# Patient Record
Sex: Male | Born: 1960 | Race: White | Hispanic: No | Smoking: Current every day smoker
Health system: Southern US, Community
[De-identification: ages and names within clinical notes are randomized; demographics above are authoritative.]

## PROBLEM LIST (undated history)

## (undated) DIAGNOSIS — E079 Disorder of thyroid, unspecified: Secondary | ICD-10-CM

## (undated) DIAGNOSIS — F410 Panic disorder [episodic paroxysmal anxiety] without agoraphobia: Secondary | ICD-10-CM

## (undated) HISTORY — PX: NASAL SEPTUM SURGERY: SHX37

## (undated) HISTORY — PX: THYROID SURGERY: SHX805

## (undated) HISTORY — PX: TONSILLECTOMY: SUR1361

---

## 2005-05-01 ENCOUNTER — Emergency Department: Payer: Self-pay | Admitting: Emergency Medicine

## 2006-05-11 ENCOUNTER — Emergency Department: Payer: Self-pay | Admitting: General Practice

## 2007-04-03 ENCOUNTER — Ambulatory Visit: Payer: Self-pay | Admitting: Podiatry

## 2010-02-15 ENCOUNTER — Ambulatory Visit: Payer: Self-pay | Admitting: Otolaryngology

## 2015-03-20 ENCOUNTER — Other Ambulatory Visit: Payer: Self-pay | Admitting: Urology

## 2015-03-20 DIAGNOSIS — R109 Unspecified abdominal pain: Secondary | ICD-10-CM

## 2015-03-20 DIAGNOSIS — N200 Calculus of kidney: Secondary | ICD-10-CM

## 2015-04-14 ENCOUNTER — Ambulatory Visit
Admission: RE | Admit: 2015-04-14 | Discharge: 2015-04-14 | Disposition: A | Discharge status: Home / Self Care | Payer: 59 | Source: Ambulatory Visit | Attending: Urology | Admitting: Urology

## 2015-04-14 DIAGNOSIS — N200 Calculus of kidney: Secondary | ICD-10-CM

## 2015-04-14 DIAGNOSIS — R109 Unspecified abdominal pain: Secondary | ICD-10-CM

## 2016-02-05 ENCOUNTER — Encounter: Payer: Self-pay | Admitting: Emergency Medicine

## 2016-02-05 ENCOUNTER — Emergency Department
Admission: EM | Admit: 2016-02-05 | Discharge: 2016-02-05 | Disposition: A | Discharge status: Home / Self Care | Payer: 59 | Attending: Emergency Medicine | Admitting: Emergency Medicine

## 2016-02-05 DIAGNOSIS — F410 Panic disorder [episodic paroxysmal anxiety] without agoraphobia: Secondary | ICD-10-CM | POA: Diagnosis present

## 2016-02-05 DIAGNOSIS — F17210 Nicotine dependence, cigarettes, uncomplicated: Secondary | ICD-10-CM | POA: Diagnosis not present

## 2016-02-05 DIAGNOSIS — E079 Disorder of thyroid, unspecified: Secondary | ICD-10-CM | POA: Diagnosis not present

## 2016-02-05 DIAGNOSIS — F419 Anxiety disorder, unspecified: Secondary | ICD-10-CM | POA: Insufficient documentation

## 2016-02-05 HISTORY — DX: Disorder of thyroid, unspecified: E07.9

## 2016-02-05 HISTORY — DX: Panic disorder (episodic paroxysmal anxiety): F41.0

## 2016-02-05 MED ORDER — LORAZEPAM 0.5 MG PO TABS: 0.5000 mg | ORAL_TABLET | Freq: Two times a day (BID) | ORAL | Status: AC

## 2016-02-05 NOTE — ED Notes (Signed)
Pt states has history of anxiety attacks and states today on the way to work had panic attack. Pt reports feeling SOB and shaking. Pt reports increased stress at work and has recently been diagnosed with borderline diabetes.

## 2016-02-05 NOTE — Discharge Instructions (Signed)

## 2016-02-05 NOTE — ED Notes (Signed)
Pt says he has been having panic attacks for about a month--since he was dx with sinus infection.  Says he took 2 courses of antibiotics and an inhaler.  Says he is having frequent attacks where he cant breath and does not feel it is from wheezing.  Says he had these once before years ago, but they got better.  Says he has them while brushing teeth, while in shower and had one today on way to work.

## 2016-02-05 NOTE — ED Notes (Signed)
Pt informed to return if any life threatening symptoms occur.  

## 2016-02-06 NOTE — ED Provider Notes (Signed)
[facility] Emergency Department Provider Note  ____________________________________________    I have reviewed the triage vital signs and the nursing notes.   HISTORY  Chief Complaint Panic Attack    HPI Larry Webster is a 55 y.o. male who presents with complaints of panic attacks. Patient reports however he holds his breath he gets extremely anxious. This can occur while he is brushing his teeth or taking a shower. He denies chest pain. He reports he believes this started when he had a sinus infection and couldn't breathe through his nose. He reports when he has these attacks he feels as though he can't breathe. Currently feels well and has no symptoms     Past Medical History  Diagnosis Date  . Panic attacks   . Thyroid disease     There are no active problems to display for this patient.   Past Surgical History  Procedure Laterality Date  . Thyroid surgery    . Nasal septum surgery    . Tonsillectomy      Current Outpatient Rx  Name  Route  Sig  Dispense  Refill  . LORazepam (ATIVAN) 0.5 MG tablet   Oral   Take 1 tablet (0.5 mg total) by mouth 2 (two) times daily.   14 tablet   0     Allergies Review of patient's allergies indicates no known allergies.  No family history on file.  Social History Social History  Substance Use Topics  . Smoking status: Current Every Day Smoker -- 0.50 packs/day    Types: Cigarettes  . Smokeless tobacco: None  . Alcohol Use: Yes     Comment: occasional    Review of Systems  Constitutional: Negative for fever. Eyes: Negative for redness ENT: Negative for sore throat Cardiovascular: Negative for chest pain Respiratory: Short of breath when having a panic attack Gastrointestinal: Negative for abdominal pain Genitourinary: Negative for dysuria. Musculoskeletal: Negative for back pain. Skin: Negative for rash. Neurological: Negative for focal weakness Psychiatric: As  above    ____________________________________________   PHYSICAL EXAM:  VITAL SIGNS: ED Triage Vitals  Enc Vitals Group     BP 02/05/16 1201 126/83 mmHg     Pulse Rate 02/05/16 1201 82     Resp 02/05/16 1201 20     Temp 02/05/16 1201 98 F (36.7 C)     Temp Source 02/05/16 1201 Oral     SpO2 02/05/16 1201 98 %     Weight 02/05/16 1201 185 lb (83.915 kg)     Height 02/05/16 1201  (1.753 m)     Head Cir --      Peak Flow --      Pain Score 02/05/16 1359 3     Pain Loc --      Pain Edu? --      Excl. in GC? --      Constitutional: Alert and oriented. Well appearing and in no distress.  Eyes: Conjunctivae are normal. No erythema or injection ENT   Head: Normocephalic and atraumatic.   Mouth/Throat: Mucous membranes are moist. Cardiovascular: Normal rate, regular rhythm. Normal and symmetric distal pulses are present in the upper extremities.  Respiratory: Normal respiratory effort without tachypnea nor retractions. Breath sounds are clear and equal bilaterally.  Gastrointestinal: Soft and non-tender in all quadrants. No distention. There is no CVA tenderness. Genitourinary: deferred Musculoskeletal: Nontender with normal range of motion in all extremities.  Neurologic:  Normal speech and language. No gross focal neurologic deficits are  appreciated. Skin:  Skin is warm, dry and intact. No rash noted. Psychiatric: Mood and affect are normal. Patient exhibits appropriate insight and judgment.  ____________________________________________    LABS (pertinent positives/negatives)  Labs Reviewed - No data to display  ____________________________________________   EKG  None  ____________________________________________    RADIOLOGY  None  ____________________________________________   PROCEDURES  Procedure(s) performed: none  Critical Care performed: none  ____________________________________________   INITIAL IMPRESSION / ASSESSMENT AND  PLAN / ED COURSE  Pertinent labs & imaging results that were available during my care of the patient were reviewed by me and considered in my medical decision making (see chart for details).  Well-appearing and in no distress. He is not having any symptoms in the emergency department. His symptoms certainly sound consistent with panic attacks given that they occur when he is holding his breath however briefly. I prescribed him a short dose of diazepam and asked him to follow up with his PCP for possible SSRI   ____________________________________________   FINAL CLINICAL IMPRESSION(S) / ED DIAGNOSES  Final diagnoses:  Anxiety attack          Larry Everyobert Rehan Holness, MD 02/06/16 [phone]

## 2018-10-11 ENCOUNTER — Encounter: Payer: Self-pay | Admitting: Emergency Medicine

## 2018-10-11 ENCOUNTER — Emergency Department: Payer: 59

## 2018-10-11 ENCOUNTER — Other Ambulatory Visit: Payer: Self-pay

## 2018-10-11 ENCOUNTER — Emergency Department
Admission: EM | Admit: 2018-10-11 | Discharge: 2018-10-11 | Disposition: A | Discharge status: Home / Self Care | Payer: 59 | Attending: Emergency Medicine | Admitting: Emergency Medicine

## 2018-10-11 DIAGNOSIS — F17210 Nicotine dependence, cigarettes, uncomplicated: Secondary | ICD-10-CM | POA: Insufficient documentation

## 2018-10-11 DIAGNOSIS — N200 Calculus of kidney: Secondary | ICD-10-CM | POA: Diagnosis not present

## 2018-10-11 DIAGNOSIS — R109 Unspecified abdominal pain: Secondary | ICD-10-CM | POA: Diagnosis present

## 2018-10-11 LAB — COMPREHENSIVE METABOLIC PANEL
ALT: 23 U/L (ref 0–44)
AST: 28 U/L (ref 15–41)
Albumin: 4.2 g/dL (ref 3.5–5.0)
Alkaline Phosphatase: 63 U/L (ref 38–126)
Anion gap: 8 (ref 5–15)
BUN: 23 mg/dL — ABNORMAL HIGH (ref 6–20)
CHLORIDE: 106 mmol/L (ref 98–111)
CO2: 27 mmol/L (ref 22–32)
CREATININE: 1.31 mg/dL — AB (ref 0.61–1.24)
Calcium: 9 mg/dL (ref 8.9–10.3)
GFR calc Af Amer: >60 mL/min (ref >60)
Glucose, Bld: 166 mg/dL — ABNORMAL HIGH (ref 70–99)
Potassium: 3.9 mmol/L (ref 3.5–5.1)
Sodium: 141 mmol/L (ref 135–145)
Total Bilirubin: 0.6 mg/dL (ref 0.3–1.2)
Total Protein: 7.5 g/dL (ref 6.5–8.1)

## 2018-10-11 LAB — URINALYSIS, COMPLETE (UACMP) WITH MICROSCOPIC
BACTERIA UA: NONE SEEN
BILIRUBIN URINE: NEGATIVE
Glucose, UA: NEGATIVE mg/dL
Ketones, ur: NEGATIVE mg/dL
Nitrite: NEGATIVE
Protein, ur: 30 mg/dL — AB
RBC / HPF: >50 RBC/hpf — ABNORMAL HIGH (ref 0–5)
SPECIFIC GRAVITY, URINE: 1.019 (ref 1.005–1.030)
Squamous Epithelial / HPF: NONE SEEN (ref 0–5)
pH: 6 (ref 5.0–8.0)

## 2018-10-11 LAB — CBC
HCT: 39.1 % (ref 39.0–52.0)
Hemoglobin: 12.7 g/dL — ABNORMAL LOW (ref 13.0–17.0)
MCH: 28.7 pg (ref 26.0–34.0)
MCHC: 32.5 g/dL (ref 30.0–36.0)
MCV: 88.5 fL (ref 80.0–100.0)
Platelets: 312 10*3/uL (ref 150–400)
RBC: 4.42 MIL/uL (ref 4.22–5.81)
RDW: 13.6 % (ref 11.5–15.5)
WBC: 8.3 10*3/uL (ref 4.0–10.5)
nRBC: 0 % (ref 0.0–0.2)

## 2018-10-11 LAB — LIPASE, BLOOD: Lipase: 35 U/L (ref 11–51)

## 2018-10-11 MED ORDER — FENTANYL CITRATE (PF) 100 MCG/2ML IJ SOLN
50.0000 ug | INTRAMUSCULAR | Status: AC | PRN
  Administered 2018-10-11 (×2): 50 ug via INTRAVENOUS

## 2018-10-11 MED ORDER — TAMSULOSIN HCL 0.4 MG PO CAPS: 0.4000 mg | ORAL_CAPSULE | Freq: Every day | ORAL | 0 refills | Status: AC

## 2018-10-11 MED ORDER — KETOROLAC TROMETHAMINE 30 MG/ML IJ SOLN
30.0000 mg | Freq: Once | INTRAMUSCULAR | Status: AC
  Administered 2018-10-11: 30 mg via INTRAVENOUS
  Filled 2018-10-11: qty 1

## 2018-10-11 MED ORDER — OXYCODONE-ACETAMINOPHEN 5-325 MG PO TABS: 1.0000 | ORAL_TABLET | ORAL | 0 refills | Status: DC | PRN

## 2018-10-11 MED ORDER — FENTANYL CITRATE (PF) 100 MCG/2ML IJ SOLN
INTRAMUSCULAR | Status: AC
  Administered 2018-10-11: 50 ug via INTRAVENOUS
  Filled 2018-10-11: qty 2

## 2018-10-11 MED ORDER — ONDANSETRON HCL 4 MG/2ML IJ SOLN
4.0000 mg | Freq: Once | INTRAMUSCULAR | Status: AC | PRN
  Administered 2018-10-11: 4 mg via INTRAVENOUS
  Filled 2018-10-11: qty 2

## 2018-10-11 NOTE — ED Provider Notes (Signed)
[facility] Emergency Department Provider Note  Time seen: 2:49 PM  I have reviewed the triage vital signs and the nursing notes.   HISTORY  Chief Complaint Abdominal Pain    HPI Larry Webster is a 57 y.o. male with a past medical history of kidney stones, anxiety, presents to the emergency department for right flank pain.  According to the patient 3 days ago he developed severe sharp right flank pain, states the pain went away after that day however returned today and was more severe.  States some nausea but denies any vomiting.  He received pain medication upon arrival states his pain is currently 1/10.  Has had kidney stones in the past 2 years ago required an operation for 1 of the kidney stones.  Denies any dysuria or known hematuria.  No fever.   Past Medical History:  Diagnosis Date  . Panic attacks   . Thyroid disease     There are no active problems to display for this patient.   Past Surgical History:  Procedure Laterality Date  . NASAL SEPTUM SURGERY    . THYROID SURGERY    . TONSILLECTOMY      Prior to Admission medications   Not on File    No Known Allergies  No family history on file.  Social History Social History   Tobacco Use  . Smoking status: Current Every Day Smoker    Packs/day: 0.50    Types: Cigarettes  Substance Use Topics  . Alcohol use: Yes    Comment: occasional  . Drug use: No    Review of Systems Constitutional: Negative for fever Cardiovascular: Negative for chest pain. Respiratory: Negative for shortness of breath. Gastrointestinal: Right flank pain.  Positive for nausea but negative for vomiting or diarrhea. Genitourinary: Negative for dysuria or hematuria Musculoskeletal: Negative for musculoskeletal complaints Skin: Negative for skin complaints  Neurological: Negative for headache All other ROS negative  ____________________________________________   PHYSICAL EXAM:  VITAL SIGNS: ED Triage  Vitals  Enc Vitals Group     BP 10/11/18 1319 (!) 149/92     Pulse Rate 10/11/18 1319 76     Resp 10/11/18 1319 20     Temp 10/11/18 1319 98.1 F (36.7 C)     Temp Source 10/11/18 1319 Oral     SpO2 10/11/18 1319 100 %     Weight 10/11/18 1319 180 lb (81.6 kg)     Height 10/11/18 1319 5\' 9"  (1.753 m)     Head Circumference --      Peak Flow --      Pain Score 10/11/18 1326 10     Pain Loc --      Pain Edu? --      Excl. in GC? --     Constitutional: Alert and oriented. Well appearing and in no distress. Eyes: Normal exam ENT   Head: Normocephalic and atraumatic   Mouth/Throat: Mucous membranes are moist. Cardiovascular: Normal rate, regular rhythm. No murmur Respiratory: Normal respiratory effort without tachypnea nor retractions. Breath sounds are clear Gastrointestinal: Soft and nontender. No distention.  No CVA tenderness to palpation. Musculoskeletal: Nontender with normal range of motion in all extremities.  Neurologic:  Normal speech and language. No gross focal neurologic deficits  Skin:  Skin is warm, dry and intact.  Psychiatric: Mood and affect are normal.  ____________________________________________    RADIOLOGY  CT appears to show a fairly large right sided mid ureteral stone with resulting hydronephrosis.  Official CT  scan read pending.  ____________________________________________   INITIAL IMPRESSION / ASSESSMENT AND PLAN / ED COURSE  Pertinent labs & imaging results that were available during my care of the patient were reviewed by me and considered in my medical decision making (see chart for details).  Patient presents the emergency department for right flank pain started 3 days ago and then went away returned again today.  Differential would include ureterolithiasis, urinary tract infection, pyelonephritis, colitis, diverticulitis, cholecystitis, appendicitis.  Overall the patient appears extremely well with a benign abdominal exam with no CVA  tenderness.  Patient's labs have resulted showing hematuria otherwise largely within normal limits.  Will obtain CT imaging without contrast to evaluate for likely ureterolithiasis.  Patient has a urologist through [facility], we will also refer to a local urologist if the patient wishes.  Patient's pain is currently 1/10 or 0/10 per patient after receiving fentanyl in triage.  Has no complaints at this time.  CT appears to show a moderate to large sized right mid ureteral stone.  Official CT read pending.  Anticipate likely discharge home as the patient is afebrile largely pain-free with a normal white blood cell count.  Urine culture has been sent.  We will discharge with Flomax and pain medication.  Patient will follow-up with his urologist.  I discussed return precautions.  ____________________________________________   FINAL CLINICAL IMPRESSION(S) / ED DIAGNOSES  Right flank pain Kidney stone   Minna Antis, MD 10/11/18 1511

## 2018-10-11 NOTE — ED Triage Notes (Addendum)
Pt arrives with complaints of right sided abdominal pain that radiates to his lower right back. Pt states the pain feels like stabbing. Pt reports when the pain spikes he feels nauseated and lightheaded. Pt reports the pain started on thanksgiving. PT very uncomfortable in triage.

## 2018-10-11 NOTE — ED Notes (Signed)
Pt unable to sign dc paper work 

## 2018-10-11 NOTE — Discharge Instructions (Signed)
Please call urology to arrange a follow-up appointment.  Please take your medication as needed, as written.  Return to the emergency department for any fever, any significant abdominal pain, or any other symptom personally concerning to yourself.

## 2018-10-12 LAB — URINE CULTURE: Culture: NO GROWTH

## 2018-10-23 ENCOUNTER — Ambulatory Visit: Payer: Self-pay | Admitting: Urology

## 2018-11-02 ENCOUNTER — Encounter

## 2018-11-02 ENCOUNTER — Ambulatory Visit: Payer: Self-pay | Admitting: Urology

## 2019-10-20 ENCOUNTER — Emergency Department: Payer: 59

## 2019-10-20 ENCOUNTER — Other Ambulatory Visit: Payer: Self-pay

## 2019-10-20 ENCOUNTER — Emergency Department
Admission: EM | Admit: 2019-10-20 | Discharge: 2019-10-20 | Disposition: A | Discharge status: Home / Self Care | Payer: 59 | Attending: Emergency Medicine | Admitting: Emergency Medicine

## 2019-10-20 ENCOUNTER — Encounter: Payer: Self-pay | Admitting: Emergency Medicine

## 2019-10-20 DIAGNOSIS — F17210 Nicotine dependence, cigarettes, uncomplicated: Secondary | ICD-10-CM | POA: Diagnosis not present

## 2019-10-20 DIAGNOSIS — R450 Nervousness: Secondary | ICD-10-CM | POA: Insufficient documentation

## 2019-10-20 DIAGNOSIS — R0789 Other chest pain: Secondary | ICD-10-CM | POA: Diagnosis not present

## 2019-10-20 DIAGNOSIS — R079 Chest pain, unspecified: Secondary | ICD-10-CM | POA: Insufficient documentation

## 2019-10-20 DIAGNOSIS — Z0000 Encounter for general adult medical examination without abnormal findings: Secondary | ICD-10-CM | POA: Diagnosis present

## 2019-10-20 DIAGNOSIS — F419 Anxiety disorder, unspecified: Secondary | ICD-10-CM | POA: Diagnosis not present

## 2019-10-20 LAB — COMPREHENSIVE METABOLIC PANEL
ALT: 22 U/L (ref 0–44)
AST: 22 U/L (ref 15–41)
Albumin: 4.5 g/dL (ref 3.5–5.0)
Alkaline Phosphatase: 58 U/L (ref 38–126)
Anion gap: 8 (ref 5–15)
BUN: 17 mg/dL (ref 6–20)
CO2: 27 mmol/L (ref 22–32)
Calcium: 8.7 mg/dL — ABNORMAL LOW (ref 8.9–10.3)
Chloride: 106 mmol/L (ref 98–111)
Creatinine, Ser: 0.93 mg/dL (ref 0.61–1.24)
GFR calc Af Amer: >60 mL/min (ref >60)
GFR calc non Af Amer: >60 mL/min (ref >60)
Glucose, Bld: 87 mg/dL (ref 70–99)
Potassium: 3.6 mmol/L (ref 3.5–5.1)
Sodium: 141 mmol/L (ref 135–145)
Total Bilirubin: 0.5 mg/dL (ref 0.3–1.2)
Total Protein: 7.2 g/dL (ref 6.5–8.1)

## 2019-10-20 LAB — CBC WITH DIFFERENTIAL/PLATELET
Abs Immature Granulocytes: 0.02 10*3/uL (ref 0.00–0.07)
Basophils Absolute: 0 10*3/uL (ref 0.0–0.1)
Basophils Relative: 0 %
Eosinophils Absolute: 0.2 10*3/uL (ref 0.0–0.5)
Eosinophils Relative: 2 %
HCT: 45.7 % (ref 39.0–52.0)
Hemoglobin: 15.3 g/dL (ref 13.0–17.0)
Immature Granulocytes: 0 %
Lymphocytes Relative: 24 %
Lymphs Abs: 1.7 10*3/uL (ref 0.7–4.0)
MCH: 30.5 pg (ref 26.0–34.0)
MCHC: 33.5 g/dL (ref 30.0–36.0)
MCV: 91.2 fL (ref 80.0–100.0)
Monocytes Absolute: 0.8 10*3/uL (ref 0.1–1.0)
Monocytes Relative: 11 %
Neutro Abs: 4.6 10*3/uL (ref 1.7–7.7)
Neutrophils Relative %: 63 %
Platelets: 232 10*3/uL (ref 150–400)
RBC: 5.01 MIL/uL (ref 4.22–5.81)
RDW: 12.4 % (ref 11.5–15.5)
WBC: 7.3 10*3/uL (ref 4.0–10.5)
nRBC: 0 % (ref 0.0–0.2)

## 2019-10-20 LAB — TROPONIN I (HIGH SENSITIVITY): Troponin I (High Sensitivity): 5 ng/L (ref <18)

## 2019-10-20 MED ORDER — HYDROXYZINE HCL 25 MG PO TABS: 25.0000 mg | ORAL_TABLET | Freq: Three times a day (TID) | ORAL | 0 refills | Status: AC | PRN

## 2019-10-20 MED ORDER — HYDROXYZINE HCL 50 MG PO TABS
50.0000 mg | ORAL_TABLET | Freq: Once | ORAL | Status: AC
  Administered 2019-10-20: 50 mg via ORAL
  Filled 2019-10-20: qty 1

## 2019-10-20 MED ORDER — ESCITALOPRAM OXALATE 5 MG PO TABS: 5.0000 mg | ORAL_TABLET | Freq: Every day | ORAL | 1 refills | Status: AC

## 2019-10-20 NOTE — ED Triage Notes (Signed)
Pt here for intermittent chest pain over last week across chest.  Also c/o SHOB. Unlabored with VSS in triage.  No loss taste or smell.  No direct covid exposure known per pt.  No fever or cough. Pain seems to be related to anxiety problems pt thinks.

## 2019-10-20 NOTE — ED Notes (Signed)
Pt denies CP at this time and reports he believes the pain is more related to anxiety.

## 2019-10-20 NOTE — ED Provider Notes (Signed)
[facility] Emergency Department Provider Note  Time seen: 7:50 PM  I have reviewed the triage vital signs and the nursing notes.   HISTORY  Chief Complaint Chest Pain   HPI Larry Webster is a 58 y.o. male with a past medical history of anxiety presents to the emergency department for medical evaluation.  According to the patient over the past several weeks he has been having increased symptoms of what he calls anxiety and panic attack.  States he will have jitteriness have chest tightness at times.  Patient states he used to be on a medication for anxiety but no longer is.  Patient is trying to get an appointment with [facility] clinic.  Patient denies any chest pain at this time states he has had intermittent chest tightness.  Denies any shortness of breath cough or fever.   Past Medical History:  Diagnosis Date  . Panic attacks   . Thyroid disease     There are no active problems to display for this patient.   Past Surgical History:  Procedure Laterality Date  . NASAL SEPTUM SURGERY    . THYROID SURGERY    . TONSILLECTOMY      Prior to Admission medications   Medication Sig Start Date End Date Taking? Authorizing Provider  oxyCODONE-acetaminophen (PERCOCET) 5-325 MG tablet Take 1 tablet by mouth every 4 (four) hours as needed for severe pain. 10/11/18   Harvest Dark, MD  tamsulosin (FLOMAX) 0.4 MG CAPS capsule Take 1 capsule (0.4 mg total) by mouth daily. 10/11/18   Harvest Dark, MD    No Known Allergies  History reviewed. No pertinent family history.  Social History Social History   Tobacco Use  . Smoking status: Current Every Day Smoker    Packs/day: 0.50    Types: Cigarettes  Substance Use Topics  . Alcohol use: Yes    Comment: occasional  . Drug use: No    Review of Systems Constitutional: Negative for fever. Cardiovascular: Intermittent chest tightness Respiratory: Negative for shortness of breath. Gastrointestinal:  Negative for abdominal pain Musculoskeletal: Negative for musculoskeletal complaints Neurological: Negative for headache All other ROS negative  ____________________________________________   PHYSICAL EXAM:  VITAL SIGNS: ED Triage Vitals  Enc Vitals Group     BP 10/20/19 1812 139/86     Pulse Rate 10/20/19 1812 73     Resp 10/20/19 1812 19     Temp 10/20/19 1812 98.4 F (36.9 C)     Temp Source 10/20/19 1812 Oral     SpO2 10/20/19 1812 99 %     Weight 10/20/19 1819 181 lb (82.1 kg)     Height 10/20/19 1819 5\' 9"  (1.753 m)     Head Circumference --      Peak Flow --      Pain Score 10/20/19 1819 0     Pain Loc --      Pain Edu? --      Excl. in [place]? --    Constitutional: Alert and oriented. Well appearing and in no distress. Eyes: Normal exam ENT      Head: Normocephalic and atraumatic.      Mouth/Throat: Mucous membranes are moist. Cardiovascular: Normal rate, regular rhythm.  Respiratory: Normal respiratory effort without tachypnea nor retractions. Breath sounds are clear  Gastrointestinal: Soft and nontender. No distention.   Musculoskeletal: Nontender with normal range of motion in all extremities.  Neurologic:  Normal speech and language. No gross focal neurologic deficits Skin:  Skin is warm, dry  and intact.  Psychiatric: Mood and affect are normal.  ____________________________________________    EKG  EKG viewed and interpreted by myself shows a normal sinus rhythm at 72 bpm with a narrow QRS, normal axis, largely normal intervals with no concerning ST changes.  ____________________________________________    RADIOLOGY  Chest x-ray negative for acute abnormality  ____________________________________________   INITIAL IMPRESSION / ASSESSMENT AND PLAN / ED COURSE  Pertinent labs & imaging results that were available during my care of the patient were reviewed by me and considered in my medical decision making (see chart for details).   Patient  presents to the emergency department for intermittent chest tightness jitteriness feeling anxious.  He used to be on a medication for anxiety but states he has not recently attempting to get back [facility].  Patient's work-up is very reassuring today including a normal EKG chest x-ray and reassuring lab work including a negative troponin.  Patient's description of his symptoms seem very consistent with anxiety.  We will place the patient on low-dose Lexapro have the patient follow-up with a primary care doctor hopefully within the next several weeks.  We will also prescribe as needed hydroxyzine for the patient.  Patient agreeable to plan of care.  Larry Webster was evaluated in Emergency Department on 10/20/2019 for the symptoms described in the history of present illness. He was evaluated in the context of the global COVID-19 pandemic, which necessitated consideration that the patient might be at risk for infection with the SARS-CoV-2 virus that causes COVID-19. Institutional protocols and algorithms that pertain to the evaluation of patients at risk for COVID-19 are in a state of rapid change based on information released by regulatory bodies including the CDC and federal and state organizations. These policies and algorithms were followed during the patient's care in the ED.  ____________________________________________   FINAL CLINICAL IMPRESSION(S) / ED DIAGNOSES  Anxiety Chest pain   Minna Antis, MD 10/20/19 1953

## 2019-11-27 ENCOUNTER — Ambulatory Visit
Admission: EM | Admit: 2019-11-27 | Discharge: 2019-11-27 | Disposition: A | Discharge status: Home / Self Care | Payer: BC Managed Care – PPO | Attending: Emergency Medicine | Admitting: Emergency Medicine

## 2019-11-27 ENCOUNTER — Encounter: Payer: Self-pay | Admitting: Emergency Medicine

## 2019-11-27 ENCOUNTER — Other Ambulatory Visit: Payer: Self-pay

## 2019-11-27 DIAGNOSIS — Z20822 Contact with and (suspected) exposure to covid-19: Secondary | ICD-10-CM | POA: Insufficient documentation

## 2019-11-27 NOTE — ED Provider Notes (Signed)
HPI  SUBJECTIVE:  Larry Webster is a 59 y.o. male who presents for Covid testing.  His last known exposure was 7 to 8 days ago.  He needs a test in order to return to work.  He denies any symptoms-no fevers, body aches, headaches, nasal congestion, sore throat, loss of sense of taste or smell, fatigue, cough, shortness of breath, nausea, vomiting, diarrhea, abdominal pain.  He is a smoker.  No history of pulmonary disease, diabetes, hypertension, chronic kidney disease, coronary disease, immunocompromise, cancer.  PMD: He has an appointment with the [facility] clinic on 2/13 to establish care.    Past Medical History:  Diagnosis Date  . Panic attacks   . Thyroid disease     Past Surgical History:  Procedure Laterality Date  . NASAL SEPTUM SURGERY    . THYROID SURGERY    . TONSILLECTOMY      History reviewed. No pertinent family history.  Social History   Tobacco Use  . Smoking status: Current Every Day Smoker    Packs/day: 0.50    Types: Cigarettes  . Smokeless tobacco: Never Used  Substance Use Topics  . Alcohol use: Yes    Comment: occasional  . Drug use: No    No current facility-administered medications for this encounter.  Current Outpatient Medications:  .  escitalopram (LEXAPRO) 5 MG tablet, Take 1 tablet (5 mg total) by mouth daily., Disp: 30 tablet, Rfl: 1 .  hydrOXYzine (ATARAX/VISTARIL) 25 MG tablet, Take 1 tablet (25 mg total) by mouth 3 (three) times daily as needed for anxiety., Disp: 20 tablet, Rfl: 0 .  tamsulosin (FLOMAX) 0.4 MG CAPS capsule, Take 1 capsule (0.4 mg total) by mouth daily., Disp: 30 capsule, Rfl: 0  No Known Allergies   ROS  As noted in HPI.   Physical Exam  BP (!) 132/92 (BP Location: Right Arm)   Pulse 73   Temp 98.4 F (36.9 C) (Oral)   Resp 16   Ht 5\' 9"  (1.753 m)   Wt 81.6 kg   SpO2 100%   BMI 26.58 kg/m   Constitutional: Well developed, well nourished, no acute distress Eyes:  EOMI, conjunctiva normal  bilaterally HENT: Normocephalic, atraumatic,mucus membranes moist Respiratory: Normal inspiratory effort, lungs clear bilaterally Cardiovascular: Normal rate regular rhythm no murmurs rubs or gallops GI: nondistended skin: No rash, skin intact Musculoskeletal: no deformities Neurologic: Alert & oriented x 3, no focal neuro deficits Psychiatric: Speech and behavior appropriate   ED Course   Medications - No data to display  Orders Placed This Encounter  Procedures  . Novel Coronavirus, NAA (Hosp order, Send-out to Ref Lab; TAT 18-24 hrs    Standing Status:   Standing    Number of Occurrences:   1    Order Specific Question:   Is this test for diagnosis or screening    Answer:   Screening    Order Specific Question:   Symptomatic for COVID-19 as defined by CDC    Answer:   No    Order Specific Question:   Hospitalized for COVID-19    Answer:   No    Order Specific Question:   Admitted to ICU for COVID-19    Answer:   No    Order Specific Question:   Previously tested for COVID-19    Answer:   No    Order Specific Question:   Resident in a congregate (group) care setting    Answer:   No    Order Specific Question:  Employed in healthcare setting    Answer:   No    No results found for this or any previous visit (from the past 24 hour(s)). No results found.  ED Clinical Impression  1. Close exposure to COVID-19 virus   2. Encounter for laboratory testing for COVID-19 virus      ED Assessment/Plan  Patient asymptomatic.  Covid PCR sent.  Will write a work note stating that he may go back to work if his Covid test comes back negative.  No orders of the defined types were placed in this encounter.   *This clinic note was created using Dragon dictation software. Therefore, there may be occasional mistakes despite careful proofreading.   ?    Melynda Ripple, MD 11/27/19 1010

## 2019-11-27 NOTE — Discharge Instructions (Addendum)
Will take 18 to 48 hours for your Covid test come back.  If you sign up for MyChart you will be able to get the results and also have hard proof that it is negative.

## 2019-11-27 NOTE — ED Triage Notes (Signed)
Patient states that he was sent home from work on [employer]. Because he had close contact with a coworker who tested positive for COVID earlier this week.  Patient states that his work told him that he needed to be tested for COVID today.  Patient denies any symptoms.

## 2019-11-28 LAB — NOVEL CORONAVIRUS, NAA (HOSP ORDER, SEND-OUT TO REF LAB; TAT 18-24 HRS): SARS-CoV-2, NAA: NOT DETECTED

## 2019-11-30 IMAGING — CT CT RENAL STONE PROTOCOL
2 of 4 series · 16 of 46 positions shown, 18 images · non-contrast
Comparison: CT abdomen pelvis 04/14/2015

CLINICAL DATA: Right flank pain

EXAM:
CT ABDOMEN AND PELVIS WITHOUT CONTRAST
TECHNIQUE: Multidetector CT imaging of the abdomen and pelvis was performed
following the standard protocol without IV contrast.

[Series 2: stone full standard · axial · 0.81mm/px · z∈[-482,-28]mm · 13 of 99 slices shown, 15 images]
[im 4/99  soft-tissue]
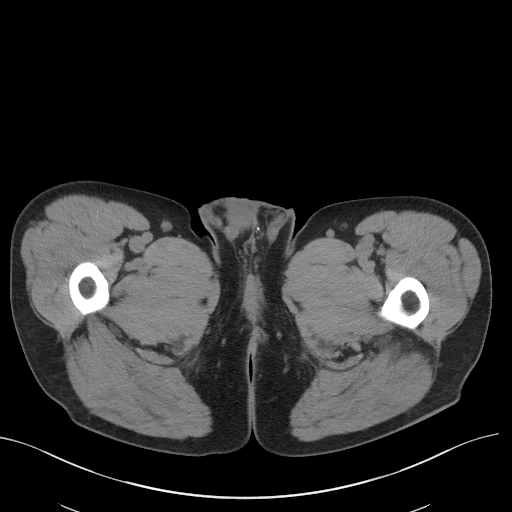
[im 4/99  bone]
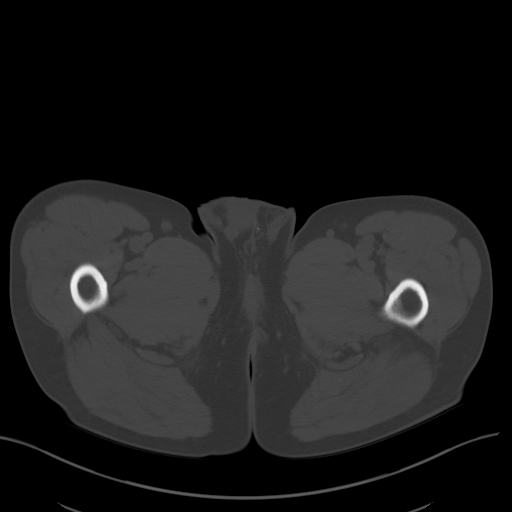
[im 12/99  soft-tissue]
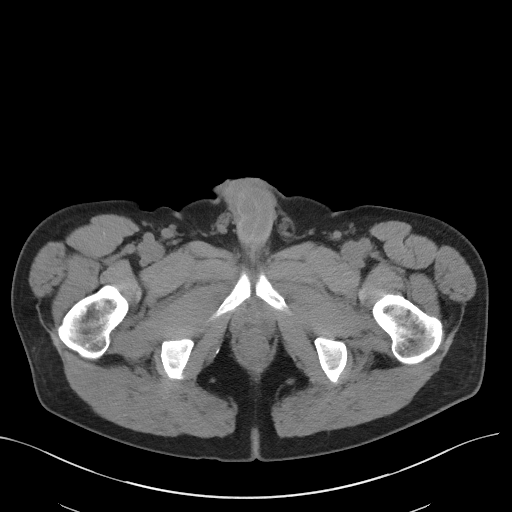
[im 19/99  soft-tissue]
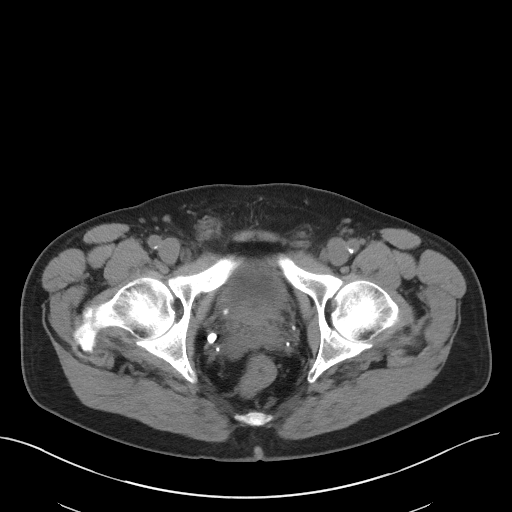
[im 27/99  soft-tissue]
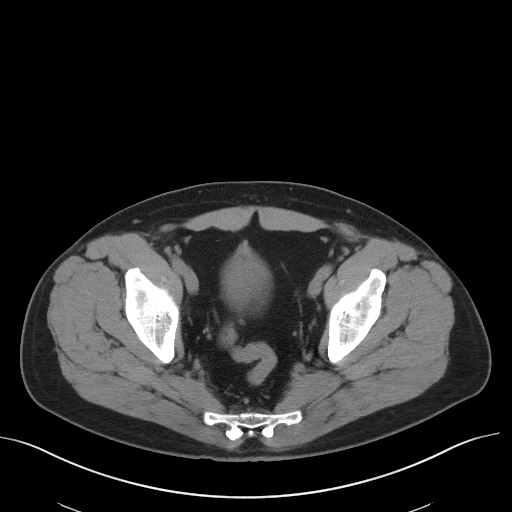
[im 34/99  soft-tissue]
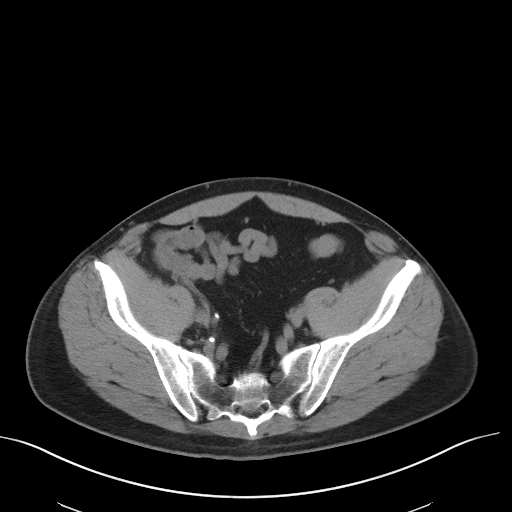
[im 42/99  soft-tissue]
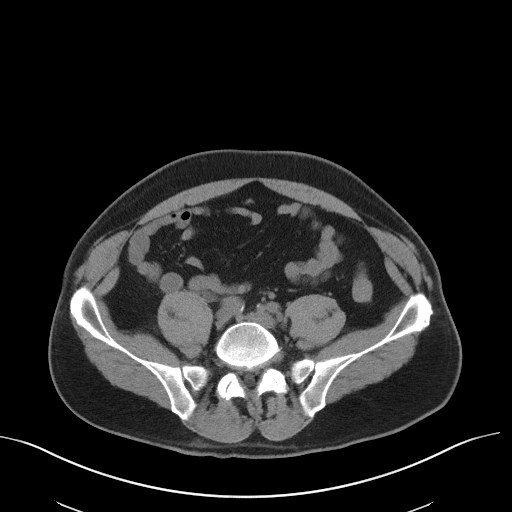
[im 50/99  soft-tissue]
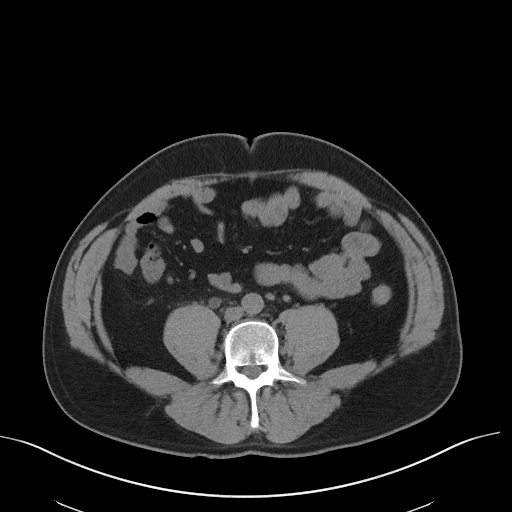
[im 57/99  soft-tissue]
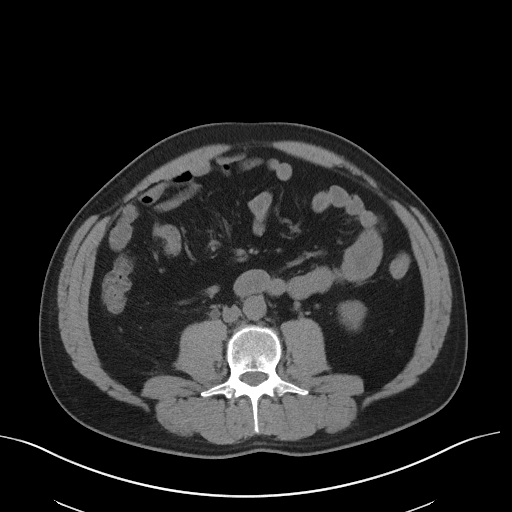
[im 65/99  soft-tissue]
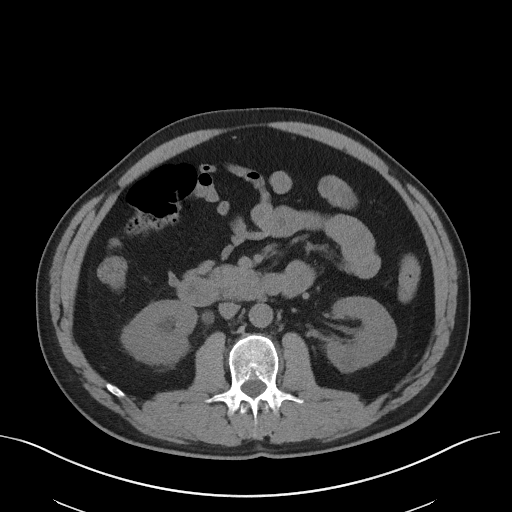
[im 65/99  bone]
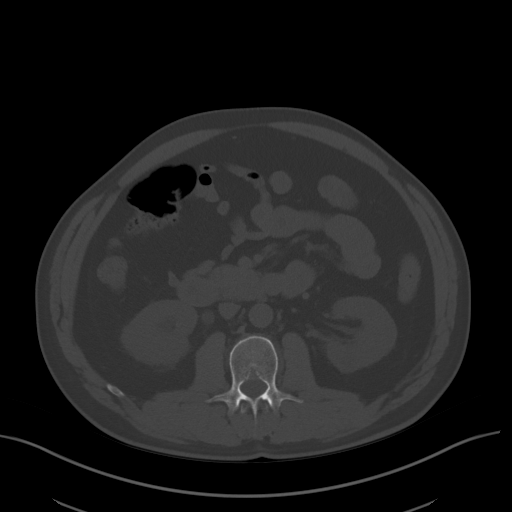
[im 72/99  soft-tissue]
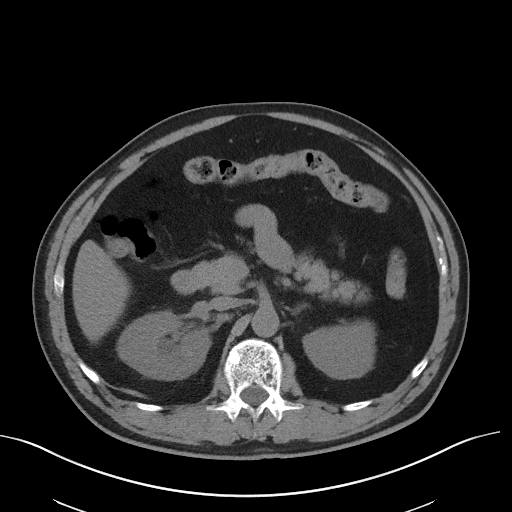
[im 80/99  soft-tissue]
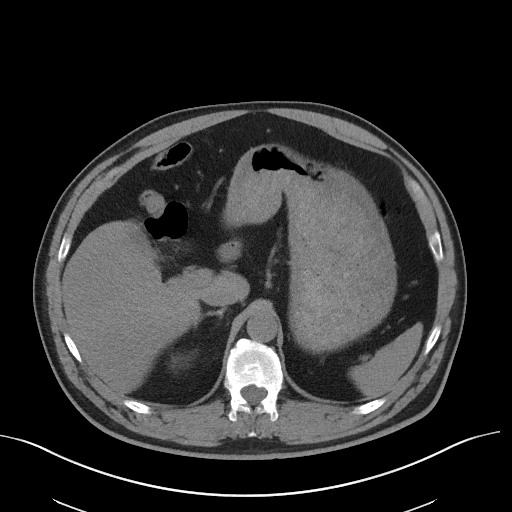
[im 87/99  soft-tissue]
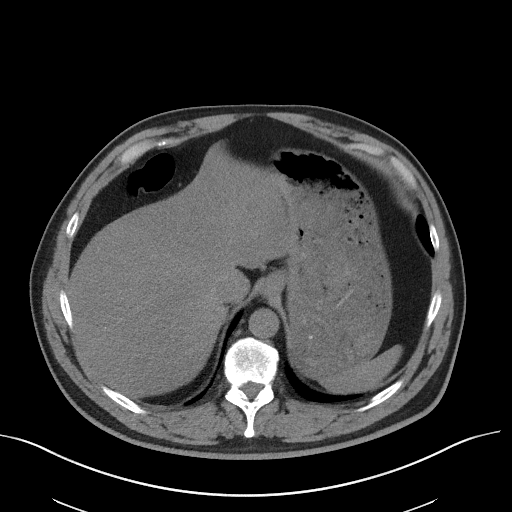
[im 95/99  soft-tissue]
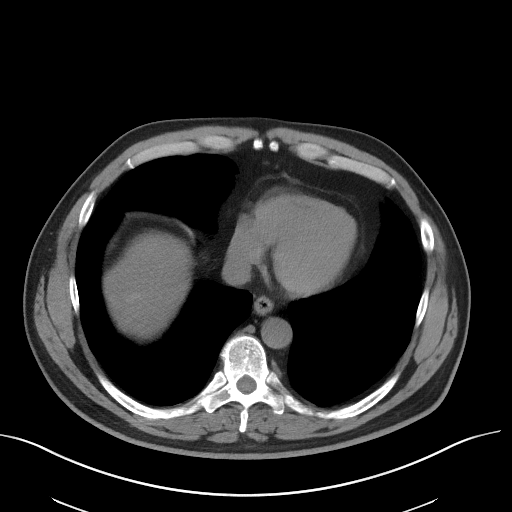

[Series 5: coronal · coronal · 0.78mm/px · 3 of 147 slices shown]
[im 49/147  soft-tissue]
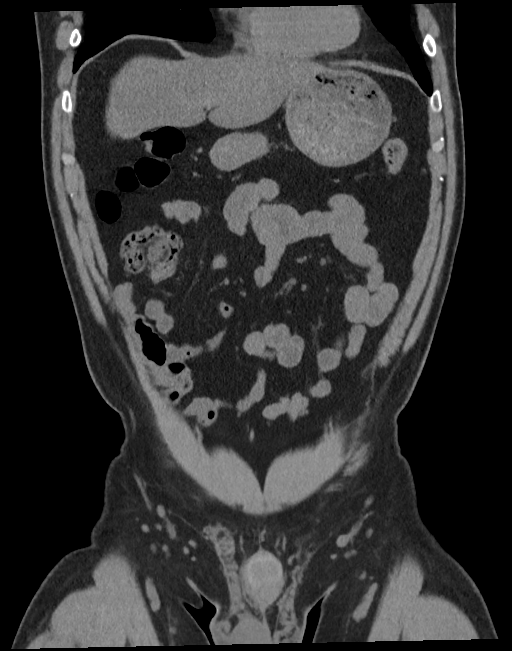
[im 65/147  soft-tissue]
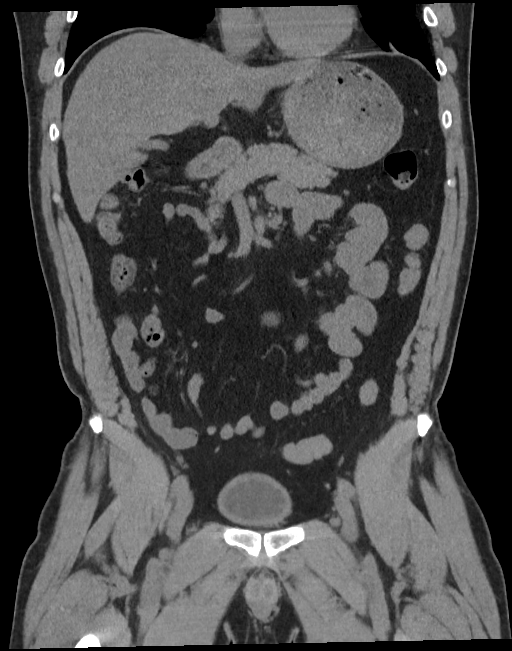
[im 82/147  soft-tissue]
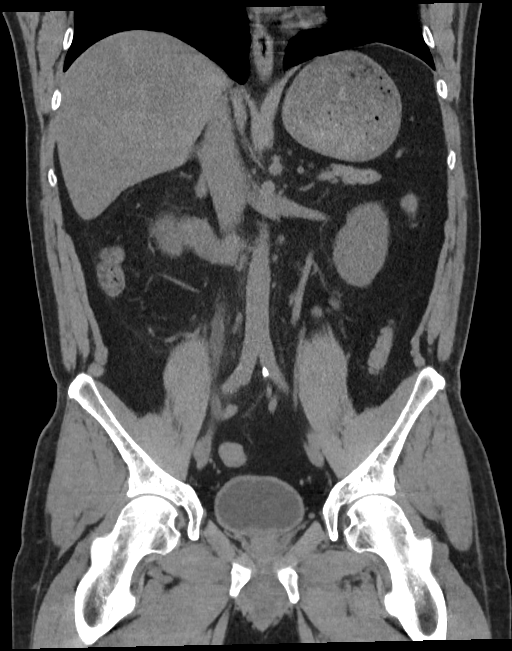

[16 of 46 positions shown; findings below may reference images not displayed]

FINDINGS: Lower chest: Negative

Hepatobiliary: Fatty infiltration liver without focal lesion.
Gallbladder and bile ducts normal.

Pancreas: Negative

Spleen: Negative

Adrenals/Urinary Tract: Right hydronephrosis and hydroureter.
Obstructing stone at the level of the right iliac crossing. Stone
measures 4 x 7 mm.

3 x 8 mm calculus left lower pole. No left renal calculus. No renal
mass. Mild bladder wall thickening

Stomach/Bowel: Negative for bowel obstruction. Negative for bowel
mass or edema. Normal appendix.

Vascular/Lymphatic: Mild atherosclerotic disease in the distal aorta
and iliac arteries without aneurysm. No lymphadenopathy

Reproductive: Moderate prostate enlargement

Other: No free fluid

Musculoskeletal: Mild lumbar degenerative change. Hemangioma L1
vertebral body. No acute skeletal abnormality.
IMPRESSION: 4 x 7 mm obstructing stone distal right ureter at the iliac
crossing.

3 x 8 mm left lower pole renal calculus without obstruction of the
left kidney

Fatty infiltration of the liver

## 2020-07-06 ENCOUNTER — Other Ambulatory Visit: Payer: Self-pay | Admitting: Physician Assistant

## 2020-07-06 DIAGNOSIS — M5412 Radiculopathy, cervical region: Secondary | ICD-10-CM

## 2020-07-24 ENCOUNTER — Other Ambulatory Visit: Payer: Self-pay

## 2020-07-24 ENCOUNTER — Ambulatory Visit
Admission: RE | Admit: 2020-07-24 | Discharge: 2020-07-24 | Disposition: A | Discharge status: Home / Self Care | Payer: BC Managed Care – PPO | Source: Ambulatory Visit | Attending: Physician Assistant | Admitting: Physician Assistant

## 2020-07-24 DIAGNOSIS — M5412 Radiculopathy, cervical region: Secondary | ICD-10-CM | POA: Diagnosis present

## 2022-03-06 ENCOUNTER — Other Ambulatory Visit: Payer: Self-pay | Admitting: Neurosurgery

## 2022-03-06 DIAGNOSIS — M542 Cervicalgia: Secondary | ICD-10-CM

## 2022-04-05 ENCOUNTER — Ambulatory Visit
Admission: RE | Admit: 2022-04-05 | Discharge: 2022-04-05 | Disposition: A | Discharge status: Home / Self Care | Payer: BC Managed Care – PPO | Source: Ambulatory Visit | Attending: Neurosurgery | Admitting: Neurosurgery

## 2022-04-05 DIAGNOSIS — M542 Cervicalgia: Secondary | ICD-10-CM | POA: Insufficient documentation

## 2022-05-22 ENCOUNTER — Telehealth: Payer: Self-pay

## 2022-05-22 DIAGNOSIS — M5412 Radiculopathy, cervical region: Secondary | ICD-10-CM

## 2022-05-22 DIAGNOSIS — M542 Cervicalgia: Secondary | ICD-10-CM

## 2022-05-22 NOTE — Telephone Encounter (Signed)
Please let him know that the orders have been placed for PT and ESIs.

## 2022-05-22 NOTE — Telephone Encounter (Signed)
-----   Message from Rockey Situ sent at 05/20/2022  3:35 PM EDT ----- Regarding: new orders Contact: [phone] Patient is returning our call. You seen him in April and ordered a MRI. He does not have access to [facility] any longer.  Larry Webster,  I was able to review your MRI of your neck. 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  The question is what you would like to do moving forward.  While this is something that we could potentially consider surgery for, would like to exhaust conservative management first.  Dr. Marcell Barlow does require patients undergo 6 weeks of physical therapy before considering any surgical intervention and I am happy to order this for you.  The other option would be to add an injection to see if this gives you relief of your symptoms.  Please let me know if you have any questions or concerns and I be more than happy to discuss things with you further. Please let me know if you would like to start PT and/or injections Danielle  He says that injections and PT did not work last time but he will do whatever he needs. He wants PT referral to go to [facility] and injections with physiatry in [place] also.

## 2022-05-29 NOTE — Telephone Encounter (Signed)
Referrals have been faxed.

## 2023-08-19 ENCOUNTER — Ambulatory Visit: Payer: BC Managed Care – PPO

## 2023-08-19 DIAGNOSIS — Z23 Encounter for immunization: Secondary | ICD-10-CM

## 2024-08-17 ENCOUNTER — Ambulatory Visit

## 2024-08-17 DIAGNOSIS — Z23 Encounter for immunization: Secondary | ICD-10-CM
